# Patient Record
Sex: Female | Born: 1960 | Race: White | Hispanic: No | Smoking: Former smoker
Health system: Southern US, Community
[De-identification: ages and names within clinical notes are randomized; demographics above are authoritative.]

## PROBLEM LIST (undated history)

## (undated) DIAGNOSIS — R112 Nausea with vomiting, unspecified: Secondary | ICD-10-CM

## (undated) DIAGNOSIS — E039 Hypothyroidism, unspecified: Secondary | ICD-10-CM

## (undated) DIAGNOSIS — T8859XA Other complications of anesthesia, initial encounter: Secondary | ICD-10-CM

## (undated) DIAGNOSIS — D649 Anemia, unspecified: Secondary | ICD-10-CM

## (undated) DIAGNOSIS — I10 Essential (primary) hypertension: Secondary | ICD-10-CM

## (undated) DIAGNOSIS — Z9889 Other specified postprocedural states: Secondary | ICD-10-CM

## (undated) DIAGNOSIS — M1990 Unspecified osteoarthritis, unspecified site: Secondary | ICD-10-CM

## (undated) DIAGNOSIS — T4145XA Adverse effect of unspecified anesthetic, initial encounter: Secondary | ICD-10-CM

## (undated) DIAGNOSIS — F419 Anxiety disorder, unspecified: Secondary | ICD-10-CM

## (undated) DIAGNOSIS — Z87898 Personal history of other specified conditions: Secondary | ICD-10-CM

## (undated) DIAGNOSIS — E785 Hyperlipidemia, unspecified: Secondary | ICD-10-CM

## (undated) DIAGNOSIS — K219 Gastro-esophageal reflux disease without esophagitis: Secondary | ICD-10-CM

## (undated) HISTORY — PX: ABDOMINAL EXPLORATION SURGERY: SHX538

---

## 2013-09-21 ENCOUNTER — Other Ambulatory Visit: Payer: Self-pay | Admitting: Neurosurgery

## 2013-09-22 ENCOUNTER — Encounter (HOSPITAL_COMMUNITY): Payer: Self-pay | Admitting: Pharmacy Technician

## 2013-09-28 ENCOUNTER — Encounter (HOSPITAL_COMMUNITY): Payer: Self-pay | Admitting: Pharmacy Technician

## 2013-09-28 ENCOUNTER — Ambulatory Visit (HOSPITAL_COMMUNITY): Admission: RE | Admit: 2013-09-28 | Payer: Worker's Compensation | Source: Ambulatory Visit | Admitting: Neurosurgery

## 2013-09-28 ENCOUNTER — Encounter (HOSPITAL_COMMUNITY): Admission: RE | Payer: Self-pay | Source: Ambulatory Visit

## 2013-09-28 SURGERY — ANTERIOR CERVICAL DECOMPRESSION/DISCECTOMY FUSION 2 LEVELS
Anesthesia: General

## 2013-10-05 ENCOUNTER — Ambulatory Visit (HOSPITAL_COMMUNITY)
Admission: RE | Admit: 2013-10-05 | Discharge: 2013-10-05 | Disposition: A | Discharge status: Home / Self Care | Payer: Self-pay | Source: Ambulatory Visit | Attending: Anesthesiology | Admitting: Anesthesiology

## 2013-10-05 ENCOUNTER — Encounter (HOSPITAL_COMMUNITY)
Admission: RE | Admit: 2013-10-05 | Discharge: 2013-10-05 | Disposition: A | Discharge status: Home / Self Care | Payer: Worker's Compensation | Source: Ambulatory Visit | Attending: Neurosurgery | Admitting: Neurosurgery

## 2013-10-05 ENCOUNTER — Encounter (HOSPITAL_COMMUNITY): Payer: Self-pay

## 2013-10-05 ENCOUNTER — Other Ambulatory Visit: Payer: Self-pay | Admitting: Neurosurgery

## 2013-10-05 DIAGNOSIS — I10 Essential (primary) hypertension: Secondary | ICD-10-CM | POA: Insufficient documentation

## 2013-10-05 DIAGNOSIS — Z87891 Personal history of nicotine dependence: Secondary | ICD-10-CM | POA: Insufficient documentation

## 2013-10-05 DIAGNOSIS — Z01812 Encounter for preprocedural laboratory examination: Secondary | ICD-10-CM | POA: Insufficient documentation

## 2013-10-05 DIAGNOSIS — Z01818 Encounter for other preprocedural examination: Secondary | ICD-10-CM | POA: Insufficient documentation

## 2013-10-05 DIAGNOSIS — Z01810 Encounter for preprocedural cardiovascular examination: Secondary | ICD-10-CM | POA: Insufficient documentation

## 2013-10-05 HISTORY — DX: Hypothyroidism, unspecified: E03.9

## 2013-10-05 HISTORY — DX: Other complications of anesthesia, initial encounter: T88.59XA

## 2013-10-05 HISTORY — DX: Other specified postprocedural states: Z98.890

## 2013-10-05 HISTORY — DX: Hyperlipidemia, unspecified: E78.5

## 2013-10-05 HISTORY — DX: Anemia, unspecified: D64.9

## 2013-10-05 HISTORY — DX: Essential (primary) hypertension: I10

## 2013-10-05 HISTORY — DX: Personal history of other specified conditions: Z87.898

## 2013-10-05 HISTORY — DX: Gastro-esophageal reflux disease without esophagitis: K21.9

## 2013-10-05 HISTORY — DX: Other specified postprocedural states: R11.2

## 2013-10-05 HISTORY — DX: Unspecified osteoarthritis, unspecified site: M19.90

## 2013-10-05 HISTORY — DX: Adverse effect of unspecified anesthetic, initial encounter: T41.45XA

## 2013-10-05 HISTORY — DX: Anxiety disorder, unspecified: F41.9

## 2013-10-05 LAB — CBC WITH DIFFERENTIAL/PLATELET
BASOS PCT: 0 % (ref 0–1)
Basophils Absolute: 0 10*3/uL (ref 0.0–0.1)
Eosinophils Absolute: 0.2 10*3/uL (ref 0.0–0.7)
Eosinophils Relative: 3 % (ref 0–5)
HCT: 38.8 % (ref 36.0–46.0)
Hemoglobin: 13.3 g/dL (ref 12.0–15.0)
LYMPHS ABS: 2.2 10*3/uL (ref 0.7–4.0)
Lymphocytes Relative: 26 % (ref 12–46)
MCH: 31.9 pg (ref 26.0–34.0)
MCHC: 34.3 g/dL (ref 30.0–36.0)
MCV: 93 fL (ref 78.0–100.0)
Monocytes Absolute: 1 10*3/uL (ref 0.1–1.0)
Monocytes Relative: 11 % (ref 3–12)
NEUTROS ABS: 5.3 10*3/uL (ref 1.7–7.7)
NEUTROS PCT: 61 % (ref 43–77)
Platelets: 206 10*3/uL (ref 150–400)
RBC: 4.17 MIL/uL (ref 3.87–5.11)
RDW: 13.3 % (ref 11.5–15.5)
WBC: 8.7 10*3/uL (ref 4.0–10.5)

## 2013-10-05 LAB — BASIC METABOLIC PANEL
BUN: 14 mg/dL (ref 6–23)
CO2: 25 mEq/L (ref 19–32)
Calcium: 9.5 mg/dL (ref 8.4–10.5)
Chloride: 105 mEq/L (ref 96–112)
Creatinine, Ser: 0.72 mg/dL (ref 0.50–1.10)
Glucose, Bld: 93 mg/dL (ref 70–99)
Potassium: 4.3 mEq/L (ref 3.7–5.3)
SODIUM: 143 meq/L (ref 137–147)

## 2013-10-05 LAB — SURGICAL PCR SCREEN
MRSA, PCR: NEGATIVE
Staphylococcus aureus: NEGATIVE

## 2013-10-05 NOTE — Progress Notes (Signed)
[facility] [place] [facility] [place] [facility] for primary No cardiologist or cardiac testing in past

## 2013-10-05 NOTE — Progress Notes (Signed)
Patient was instructed to not leave until bp could be reassessed but patient left before bp was rechecked. Will assess on morning of surgery. bp upon arrival to PAT appointment was taken several times with dinamap with "error reading" before the low reading showed.

## 2013-10-05 NOTE — Progress Notes (Signed)
10/05/13 1249  OBSTRUCTIVE SLEEP APNEA  Have you ever been diagnosed with sleep apnea through a sleep study? No  Do you snore loudly (loud enough to be heard through closed doors)?  1  Do you often feel tired, fatigued, or sleepy during the daytime? 1  Has anyone observed you stop breathing during your sleep? 0  Do you have, or are you being treated for high blood pressure? 1  BMI more than 35 kg/m2? 0  Age over 55 years old? 1  Neck circumference greater than 40 cm/18 inches? 0  Gender: 0  Obstructive Sleep Apnea Score 4  Score 4 or greater  Results sent to PCP

## 2013-10-05 NOTE — Pre-Procedure Instructions (Signed)
Frederic JerichoDarlene Lothrop  10/05/2013   Your procedure is scheduled on:  Monday, March 16th  Report to Admitting at 0800 AM.  Call this number if you have problems the morning of surgery: [phone]   Remember:   Do not eat food or drink liquids after midnight.   Take these medicines the morning of surgery with A SIP OF WATER: norvasc, synthroid, lyrica, ultram if needed   Do not wear jewelry, make-up or nail polish.  Do not wear lotions, powders, or perfumes,deodorant.  Do not shave 48 hours prior to surgery. Men may shave face and neck.  Do not bring valuables to the hospital.  [facility] belongings or valuables.               Contacts, dentures or bridgework may not be worn into surgery.  Leave suitcase in the car. After surgery it may be brought to your room.  For patients admitted to the hospital, discharge time is determined by your treatment team.               Patients discharged the day of surgery will not be allowed to drive home.  Please read over the following fact sheets that you were given: Pain Booklet, Coughing and Deep Breathing, MRSA Information and Surgical Site Infection Prevention [place] - Preparing for Surgery  Before surgery, you can play an important role.  Because skin is not sterile, your skin needs to be as free of germs as possible.  You can reduce the number of germs on you skin by washing with CHG (chlorahexidine gluconate) soap before surgery.  CHG is an antiseptic cleaner which kills germs and bonds with the skin to continue killing germs even after washing.  Please DO NOT use if you have an allergy to CHG or antibacterial soaps.  If your skin becomes reddened/irritated stop using the CHG and inform your nurse when you arrive at Short Stay.  Do not shave (including legs and underarms) for at least 48 hours prior to the first CHG shower.  You may shave your face.  Please follow these instructions carefully:   1.  Shower with  CHG Soap the night before surgery and the morning of Surgery.  2.  If you choose to wash your hair, wash your hair first as usual with your normal shampoo.  3.  After you shampoo, rinse your hair and body thoroughly to remove the shampoo.  4.  Use CHG as you would any other liquid soap.  You can apply CHG directly to the skin and wash gently with scrungie or a clean washcloth.  5.  Apply the CHG Soap to your body ONLY FROM THE NECK DOWN.  Do not use on open wounds or open sores.  Avoid contact with your eyes, ears, mouth and genitals (private parts).  Wash genitals (private parts) with your normal soap.  6.  Wash thoroughly, paying special attention to the area where your surgery will be performed.  7.  Thoroughly rinse your body with warm water from the neck down.  8.  DO NOT shower/wash with your normal soap after using and rinsing off the CHG Soap.  9.  Pat yourself dry with a clean towel.            10.  Wear clean pajamas.            11.  Place clean sheets on your bed the night of your first shower and do not sleep  with pets.  Day of Surgery  Do not apply any lotions/deodorants the morning of surgery.  Please wear clean clothes to the hospital/surgery center.

## 2013-10-05 NOTE — Progress Notes (Signed)
Called vanessa at dr. [facility] office regarding need for consent order.  Labs/pre op orders were in epic but consent form was not.

## 2013-10-11 MED ORDER — CEFAZOLIN SODIUM-DEXTROSE 2-3 GM-% IV SOLR
2.0000 g | INTRAVENOUS | Status: AC
  Administered 2013-10-12: 2 g via INTRAVENOUS
  Filled 2013-10-11: qty 50

## 2013-10-12 ENCOUNTER — Ambulatory Visit (HOSPITAL_COMMUNITY): Payer: Worker's Compensation | Admitting: Anesthesiology

## 2013-10-12 ENCOUNTER — Encounter (HOSPITAL_COMMUNITY)
Admission: RE | Disposition: A | Discharge status: Home / Self Care | Payer: Self-pay | Source: Ambulatory Visit | Attending: Neurosurgery

## 2013-10-12 ENCOUNTER — Encounter (HOSPITAL_COMMUNITY): Payer: Self-pay | Admitting: *Deleted

## 2013-10-12 ENCOUNTER — Encounter (HOSPITAL_COMMUNITY): Payer: Worker's Compensation | Admitting: Anesthesiology

## 2013-10-12 ENCOUNTER — Ambulatory Visit (HOSPITAL_COMMUNITY): Payer: Worker's Compensation

## 2013-10-12 ENCOUNTER — Ambulatory Visit (HOSPITAL_COMMUNITY)
Admission: RE | Admit: 2013-10-12 | Discharge: 2013-10-12 | Disposition: A | Discharge status: Home / Self Care | Payer: Worker's Compensation | Source: Ambulatory Visit | Attending: Neurosurgery | Admitting: Neurosurgery

## 2013-10-12 DIAGNOSIS — Z881 Allergy status to other antibiotic agents status: Secondary | ICD-10-CM | POA: Insufficient documentation

## 2013-10-12 DIAGNOSIS — K219 Gastro-esophageal reflux disease without esophagitis: Secondary | ICD-10-CM | POA: Insufficient documentation

## 2013-10-12 DIAGNOSIS — I10 Essential (primary) hypertension: Secondary | ICD-10-CM | POA: Insufficient documentation

## 2013-10-12 DIAGNOSIS — F411 Generalized anxiety disorder: Secondary | ICD-10-CM | POA: Insufficient documentation

## 2013-10-12 DIAGNOSIS — Z91018 Allergy to other foods: Secondary | ICD-10-CM | POA: Insufficient documentation

## 2013-10-12 DIAGNOSIS — Z885 Allergy status to narcotic agent status: Secondary | ICD-10-CM | POA: Insufficient documentation

## 2013-10-12 DIAGNOSIS — E785 Hyperlipidemia, unspecified: Secondary | ICD-10-CM | POA: Insufficient documentation

## 2013-10-12 DIAGNOSIS — M47812 Spondylosis without myelopathy or radiculopathy, cervical region: Secondary | ICD-10-CM

## 2013-10-12 DIAGNOSIS — Z79899 Other long term (current) drug therapy: Secondary | ICD-10-CM | POA: Insufficient documentation

## 2013-10-12 DIAGNOSIS — E039 Hypothyroidism, unspecified: Secondary | ICD-10-CM | POA: Insufficient documentation

## 2013-10-12 HISTORY — PX: ANTERIOR CERVICAL DECOMP/DISCECTOMY FUSION: SHX1161

## 2013-10-12 SURGERY — ANTERIOR CERVICAL DECOMPRESSION/DISCECTOMY FUSION 2 LEVELS
Anesthesia: General

## 2013-10-12 MED ORDER — PHENYLEPHRINE HCL 10 MG/ML IJ SOLN
INTRAMUSCULAR | Status: AC
  Filled 2013-10-12: qty 1

## 2013-10-12 MED ORDER — SIMVASTATIN 20 MG PO TABS
20.0000 mg | ORAL_TABLET | Freq: Every day | ORAL | Status: DC
  Filled 2013-10-12: qty 1

## 2013-10-12 MED ORDER — LEVOTHYROXINE SODIUM 50 MCG PO TABS
50.0000 ug | ORAL_TABLET | Freq: Every day | ORAL | Status: DC
  Filled 2013-10-12: qty 1

## 2013-10-12 MED ORDER — TRAMADOL HCL 50 MG PO TABS
50.0000 mg | ORAL_TABLET | Freq: Every day | ORAL | Status: DC | PRN
  Administered 2013-10-12: 100 mg via ORAL
  Filled 2013-10-12: qty 2

## 2013-10-12 MED ORDER — ACETAMINOPHEN 650 MG RE SUPP: 650.0000 mg | RECTAL | Status: DC | PRN

## 2013-10-12 MED ORDER — GLYCOPYRROLATE 0.2 MG/ML IJ SOLN
INTRAMUSCULAR | Status: DC | PRN
  Administered 2013-10-12: 0.4 mg via INTRAVENOUS

## 2013-10-12 MED ORDER — SODIUM CHLORIDE 0.9 % IJ SOLN
3.0000 mL | Freq: Two times a day (BID) | INTRAMUSCULAR | Status: DC
  Administered 2013-10-12: 3 mL via INTRAVENOUS

## 2013-10-12 MED ORDER — SODIUM CHLORIDE 0.9 % IJ SOLN: 3.0000 mL | INTRAMUSCULAR | Status: DC | PRN

## 2013-10-12 MED ORDER — DEXAMETHASONE SODIUM PHOSPHATE 10 MG/ML IJ SOLN
INTRAMUSCULAR | Status: AC
  Administered 2013-10-12: 10 mg via INTRAVENOUS
  Filled 2013-10-12: qty 1

## 2013-10-12 MED ORDER — THROMBIN 5000 UNITS EX SOLR
OROMUCOSAL | Status: DC | PRN
  Administered 2013-10-12: 11:00:00 via TOPICAL

## 2013-10-12 MED ORDER — ROCURONIUM BROMIDE 100 MG/10ML IV SOLN
INTRAVENOUS | Status: DC | PRN
  Administered 2013-10-12: 50 mg via INTRAVENOUS

## 2013-10-12 MED ORDER — HYDROCODONE-ACETAMINOPHEN 5-325 MG PO TABS: 1.0000 | ORAL_TABLET | ORAL | Status: DC | PRN

## 2013-10-12 MED ORDER — NEOSTIGMINE METHYLSULFATE 1 MG/ML IJ SOLN
INTRAMUSCULAR | Status: DC | PRN
  Administered 2013-10-12: 3 mg via INTRAVENOUS

## 2013-10-12 MED ORDER — LIDOCAINE HCL (CARDIAC) 20 MG/ML IV SOLN
INTRAVENOUS | Status: DC | PRN
  Administered 2013-10-12: 80 mg via INTRAVENOUS

## 2013-10-12 MED ORDER — CEFAZOLIN SODIUM 1-5 GM-% IV SOLN
1.0000 g | Freq: Three times a day (TID) | INTRAVENOUS | Status: DC
  Administered 2013-10-12: 1 g via INTRAVENOUS
  Filled 2013-10-12 (×2): qty 50

## 2013-10-12 MED ORDER — ACETAMINOPHEN 325 MG PO TABS: 650.0000 mg | ORAL_TABLET | ORAL | Status: DC | PRN

## 2013-10-12 MED ORDER — PROPOFOL 10 MG/ML IV BOLUS
INTRAVENOUS | Status: AC
  Filled 2013-10-12: qty 20

## 2013-10-12 MED ORDER — ONDANSETRON HCL 4 MG/2ML IJ SOLN
4.0000 mg | INTRAMUSCULAR | Status: DC | PRN
Start: 2013-10-12 — End: 2013-10-12

## 2013-10-12 MED ORDER — PROMETHAZINE HCL 25 MG/ML IJ SOLN: 6.2500 mg | INTRAMUSCULAR | Status: DC | PRN

## 2013-10-12 MED ORDER — OXYCODONE-ACETAMINOPHEN 5-325 MG PO TABS: 1.0000 | ORAL_TABLET | ORAL | Status: DC | PRN

## 2013-10-12 MED ORDER — ROCURONIUM BROMIDE 50 MG/5ML IV SOLN
INTRAVENOUS | Status: AC
  Filled 2013-10-12: qty 1

## 2013-10-12 MED ORDER — PREGABALIN 50 MG PO CAPS: 75.0000 mg | ORAL_CAPSULE | Freq: Two times a day (BID) | ORAL | Status: DC

## 2013-10-12 MED ORDER — FENTANYL CITRATE 0.05 MG/ML IJ SOLN
INTRAMUSCULAR | Status: AC
  Filled 2013-10-12: qty 5

## 2013-10-12 MED ORDER — MIDAZOLAM HCL 2 MG/2ML IJ SOLN
INTRAMUSCULAR | Status: AC
  Filled 2013-10-12: qty 2

## 2013-10-12 MED ORDER — PHENOL 1.4 % MT LIQD: 1.0000 | OROMUCOSAL | Status: DC | PRN

## 2013-10-12 MED ORDER — PHENYLEPHRINE HCL 10 MG/ML IJ SOLN
INTRAMUSCULAR | Status: DC | PRN
  Administered 2013-10-12: 120 ug via INTRAVENOUS
  Administered 2013-10-12 (×2): 80 ug via INTRAVENOUS
  Administered 2013-10-12: 120 ug via INTRAVENOUS

## 2013-10-12 MED ORDER — LACTATED RINGERS IV SOLN
INTRAVENOUS | Status: DC
  Administered 2013-10-12 (×2): via INTRAVENOUS

## 2013-10-12 MED ORDER — THROMBIN 5000 UNITS EX SOLR
CUTANEOUS | Status: DC | PRN
  Administered 2013-10-12 (×2): 5000 [IU] via TOPICAL

## 2013-10-12 MED ORDER — TIZANIDINE HCL 4 MG PO TABS
4.0000 mg | ORAL_TABLET | Freq: Four times a day (QID) | ORAL | Status: DC | PRN
  Administered 2013-10-12: 4 mg via ORAL
  Filled 2013-10-12: qty 1

## 2013-10-12 MED ORDER — SCOPOLAMINE 1 MG/3DAYS TD PT72
MEDICATED_PATCH | TRANSDERMAL | Status: DC | PRN
  Administered 2013-10-12: 1 via TRANSDERMAL

## 2013-10-12 MED ORDER — MEPERIDINE HCL 25 MG/ML IJ SOLN: 6.2500 mg | INTRAMUSCULAR | Status: DC | PRN

## 2013-10-12 MED ORDER — MIDAZOLAM HCL 2 MG/2ML IJ SOLN: 0.5000 mg | Freq: Once | INTRAMUSCULAR | Status: DC | PRN

## 2013-10-12 MED ORDER — AMLODIPINE BESYLATE 10 MG PO TABS: 10.0000 mg | ORAL_TABLET | Freq: Every day | ORAL | Status: DC

## 2013-10-12 MED ORDER — MENTHOL 3 MG MT LOZG: 1.0000 | LOZENGE | OROMUCOSAL | Status: DC | PRN

## 2013-10-12 MED ORDER — SODIUM CHLORIDE 0.9 % IV SOLN
10.0000 mg | INTRAVENOUS | Status: DC | PRN
  Administered 2013-10-12: 10 ug/min via INTRAVENOUS

## 2013-10-12 MED ORDER — EPHEDRINE SULFATE 50 MG/ML IJ SOLN
INTRAMUSCULAR | Status: DC | PRN
  Administered 2013-10-12 (×2): 5 mg via INTRAVENOUS
  Administered 2013-10-12: 10 mg via INTRAVENOUS

## 2013-10-12 MED ORDER — HYDROMORPHONE HCL PF 1 MG/ML IJ SOLN
INTRAMUSCULAR | Status: AC
  Filled 2013-10-12: qty 1

## 2013-10-12 MED ORDER — SODIUM CHLORIDE 0.9 % IR SOLN
Status: DC | PRN
  Administered 2013-10-12: 11:00:00

## 2013-10-12 MED ORDER — HYDROMORPHONE HCL PF 1 MG/ML IJ SOLN
0.2500 mg | INTRAMUSCULAR | Status: DC | PRN
  Administered 2013-10-12 (×3): 0.5 mg via INTRAVENOUS

## 2013-10-12 MED ORDER — VECURONIUM BROMIDE 10 MG IV SOLR
INTRAVENOUS | Status: AC
  Filled 2013-10-12: qty 10

## 2013-10-12 MED ORDER — ONDANSETRON HCL 4 MG/2ML IJ SOLN
INTRAMUSCULAR | Status: AC
  Filled 2013-10-12: qty 2

## 2013-10-12 MED ORDER — GLYCOPYRROLATE 0.2 MG/ML IJ SOLN
INTRAMUSCULAR | Status: AC
  Filled 2013-10-12: qty 2

## 2013-10-12 MED ORDER — CYCLOBENZAPRINE HCL 10 MG PO TABS: 10.0000 mg | ORAL_TABLET | Freq: Three times a day (TID) | ORAL | Status: DC | PRN

## 2013-10-12 MED ORDER — HYDROMORPHONE HCL PF 1 MG/ML IJ SOLN: 0.5000 mg | INTRAMUSCULAR | Status: DC | PRN

## 2013-10-12 MED ORDER — PROPOFOL 10 MG/ML IV BOLUS
INTRAVENOUS | Status: DC | PRN
  Administered 2013-10-12: 120 mg via INTRAVENOUS

## 2013-10-12 MED ORDER — OXYCODONE HCL 5 MG PO TABS: 5.0000 mg | ORAL_TABLET | ORAL | Status: AC | PRN

## 2013-10-12 MED ORDER — 0.9 % SODIUM CHLORIDE (POUR BTL) OPTIME
TOPICAL | Status: DC | PRN
  Administered 2013-10-12: 1000 mL

## 2013-10-12 MED ORDER — FENTANYL CITRATE 0.05 MG/ML IJ SOLN
INTRAMUSCULAR | Status: DC | PRN
  Administered 2013-10-12: 50 ug via INTRAVENOUS
  Administered 2013-10-12: 100 ug via INTRAVENOUS
  Administered 2013-10-12 (×2): 50 ug via INTRAVENOUS

## 2013-10-12 MED ORDER — STERILE WATER FOR INJECTION IJ SOLN
INTRAMUSCULAR | Status: AC
  Filled 2013-10-12: qty 10

## 2013-10-12 MED ORDER — ONDANSETRON HCL 4 MG/2ML IJ SOLN
INTRAMUSCULAR | Status: DC | PRN
  Administered 2013-10-12: 4 mg via INTRAVENOUS

## 2013-10-12 MED ORDER — SENNA 8.6 MG PO TABS: 1.0000 | ORAL_TABLET | Freq: Two times a day (BID) | ORAL | Status: DC

## 2013-10-12 MED ORDER — SODIUM CHLORIDE 0.9 % IJ SOLN
INTRAMUSCULAR | Status: AC
  Filled 2013-10-12: qty 10

## 2013-10-12 MED ORDER — EPHEDRINE SULFATE 50 MG/ML IJ SOLN
INTRAMUSCULAR | Status: AC
  Filled 2013-10-12: qty 1

## 2013-10-12 MED ORDER — OXYCODONE HCL 5 MG/5ML PO SOLN: 5.0000 mg | Freq: Once | ORAL | Status: DC | PRN

## 2013-10-12 MED ORDER — HEMOSTATIC AGENTS (NO CHARGE) OPTIME
TOPICAL | Status: DC | PRN
  Administered 2013-10-12: 1

## 2013-10-12 MED ORDER — ALUM & MAG HYDROXIDE-SIMETH 200-200-20 MG/5ML PO SUSP: 30.0000 mL | Freq: Four times a day (QID) | ORAL | Status: DC | PRN

## 2013-10-12 MED ORDER — LIDOCAINE HCL (CARDIAC) 20 MG/ML IV SOLN
INTRAVENOUS | Status: AC
Start: 2013-10-12 — End: 2013-10-12
  Filled 2013-10-12: qty 5

## 2013-10-12 MED ORDER — DEXAMETHASONE SODIUM PHOSPHATE 10 MG/ML IJ SOLN: 10.0000 mg | INTRAMUSCULAR | Status: DC

## 2013-10-12 MED ORDER — OXYCODONE HCL 5 MG PO TABS: 5.0000 mg | ORAL_TABLET | Freq: Once | ORAL | Status: DC | PRN

## 2013-10-12 MED ORDER — SCOPOLAMINE 1 MG/3DAYS TD PT72
MEDICATED_PATCH | TRANSDERMAL | Status: AC
  Filled 2013-10-12: qty 1

## 2013-10-12 MED ORDER — CYCLOBENZAPRINE HCL 10 MG PO TABS: 10.0000 mg | ORAL_TABLET | Freq: Three times a day (TID) | ORAL | Status: AC | PRN

## 2013-10-12 MED ORDER — MIDAZOLAM HCL 5 MG/5ML IJ SOLN
INTRAMUSCULAR | Status: DC | PRN
  Administered 2013-10-12: 2 mg via INTRAVENOUS

## 2013-10-12 SURGICAL SUPPLY — 62 items
BAG DECANTER FOR FLEXI CONT (MISCELLANEOUS) ×3 IMPLANT
BENZOIN TINCTURE PRP APPL 2/3 (GAUZE/BANDAGES/DRESSINGS) ×3 IMPLANT
BRUSH SCRUB EZ PLAIN DRY (MISCELLANEOUS) ×3 IMPLANT
BUR MATCHSTICK NEURO 3.0 LAGG (BURR) ×3 IMPLANT
CAGE PEEK 6X14X11 (Cage) ×4 IMPLANT
CAGE SPNL 11X14X6XRADOPQ (Cage) ×2 IMPLANT
CANISTER SUCT 3000ML (MISCELLANEOUS) ×3 IMPLANT
CLOSURE WOUND 1/2 X4 (GAUZE/BANDAGES/DRESSINGS) ×1
CONT SPEC 4OZ CLIKSEAL STRL BL (MISCELLANEOUS) ×3 IMPLANT
DRAPE C-ARM 42X72 X-RAY (DRAPES) ×6 IMPLANT
DRAPE LAPAROTOMY 100X72 PEDS (DRAPES) ×3 IMPLANT
DRAPE MICROSCOPE ZEISS OPMI (DRAPES) ×3 IMPLANT
DRAPE POUCH INSTRU U-SHP 10X18 (DRAPES) ×3 IMPLANT
DRILL BIT (BIT) ×3 IMPLANT
DURAPREP 6ML APPLICATOR 50/CS (WOUND CARE) ×3 IMPLANT
ELECT COATED BLADE 2.86 ST (ELECTRODE) ×3 IMPLANT
ELECT REM PT RETURN 9FT ADLT (ELECTROSURGICAL) ×3
ELECTRODE REM PT RTRN 9FT ADLT (ELECTROSURGICAL) ×1 IMPLANT
GAUZE SPONGE 4X4 16PLY XRAY LF (GAUZE/BANDAGES/DRESSINGS) IMPLANT
GLOVE BIO SURGEON STRL SZ8 (GLOVE) ×3 IMPLANT
GLOVE BIOGEL PI IND STRL 7.0 (GLOVE) ×2 IMPLANT
GLOVE BIOGEL PI INDICATOR 7.0 (GLOVE) ×4
GLOVE ECLIPSE 8.5 STRL (GLOVE) ×3 IMPLANT
GLOVE EXAM NITRILE LRG STRL (GLOVE) IMPLANT
GLOVE EXAM NITRILE MD LF STRL (GLOVE) IMPLANT
GLOVE EXAM NITRILE XL STR (GLOVE) IMPLANT
GLOVE EXAM NITRILE XS STR PU (GLOVE) IMPLANT
GLOVE INDICATOR 8.5 STRL (GLOVE) ×3 IMPLANT
GLOVE SS BIOGEL STRL SZ 6.5 (GLOVE) ×3 IMPLANT
GLOVE SUPERSENSE BIOGEL SZ 6.5 (GLOVE) ×6
GOWN BRE IMP SLV AUR LG STRL (GOWN DISPOSABLE) IMPLANT
GOWN BRE IMP SLV AUR XL STRL (GOWN DISPOSABLE) ×3 IMPLANT
GOWN STRL REIN 2XL LVL4 (GOWN DISPOSABLE) IMPLANT
GOWN STRL REUS W/ TWL LRG LVL3 (GOWN DISPOSABLE) ×2 IMPLANT
GOWN STRL REUS W/ TWL XL LVL3 (GOWN DISPOSABLE) ×1 IMPLANT
GOWN STRL REUS W/TWL LRG LVL3 (GOWN DISPOSABLE) ×4
GOWN STRL REUS W/TWL XL LVL3 (GOWN DISPOSABLE) ×2
HALTER HD/CHIN CERV TRACTION D (MISCELLANEOUS) ×3 IMPLANT
HEMOSTAT SURGICEL 2X14 (HEMOSTASIS) IMPLANT
KIT BASIN OR (CUSTOM PROCEDURE TRAY) ×3 IMPLANT
KIT ROOM TURNOVER OR (KITS) ×3 IMPLANT
NEEDLE SPNL 20GX3.5 QUINCKE YW (NEEDLE) ×3 IMPLANT
NS IRRIG 1000ML POUR BTL (IV SOLUTION) ×3 IMPLANT
PACK LAMINECTOMY NEURO (CUSTOM PROCEDURE TRAY) ×3 IMPLANT
PAD ARMBOARD 7.5X6 YLW CONV (MISCELLANEOUS) ×9 IMPLANT
PLATE 37MM ×3 IMPLANT
RUBBERBAND STERILE (MISCELLANEOUS) ×6 IMPLANT
SCREW ST 13X4XST VA NS SPNE (Screw) ×6 IMPLANT
SCREW ST VAR 4 ATL (Screw) ×12 IMPLANT
SPONGE GAUZE 4X4 12PLY (GAUZE/BANDAGES/DRESSINGS) ×3 IMPLANT
SPONGE INTESTINAL PEANUT (DISPOSABLE) ×3 IMPLANT
SPONGE SURGIFOAM ABS GEL SZ50 (HEMOSTASIS) ×3 IMPLANT
STRIP CLOSURE SKIN 1/2X4 (GAUZE/BANDAGES/DRESSINGS) ×2 IMPLANT
SUT PDS AB 5-0 P3 18 (SUTURE) ×3 IMPLANT
SUT VIC AB 3-0 SH 8-18 (SUTURE) ×3 IMPLANT
SYR 20ML ECCENTRIC (SYRINGE) ×3 IMPLANT
TAPE CLOTH 4X10 WHT NS (GAUZE/BANDAGES/DRESSINGS) ×3 IMPLANT
TAPE CLOTH SURG 4X10 WHT LF (GAUZE/BANDAGES/DRESSINGS) ×3 IMPLANT
TOWEL OR 17X24 6PK STRL BLUE (TOWEL DISPOSABLE) ×3 IMPLANT
TOWEL OR 17X26 10 PK STRL BLUE (TOWEL DISPOSABLE) ×3 IMPLANT
TRAP SPECIMEN MUCOUS 40CC (MISCELLANEOUS) ×3 IMPLANT
WATER STERILE IRR 1000ML POUR (IV SOLUTION) ×3 IMPLANT

## 2013-10-12 NOTE — Discharge Summary (Signed)
Physician Discharge Summary  Patient ID: Katelyn JerichoDarlene Lobdell MRN: [id number] DOB/AGE: 05-Apr-1961 53 y.o.  Admit date: 10/12/2013 Discharge date: 10/12/2013  Admission Diagnoses:  Discharge Diagnoses:  Principal Problem:   Cervical spondylosis without myelopathy   Discharged Condition: good  Hospital Course: Patient in the hospital where she underwent uncomplicated 2 level anterior cervical decompression and fusion. Postoperative she is doing well. Preoperative pain improved. Strength and sensation intact. Ready for discharge home.  Consults:   Significant Diagnostic Studies:   Treatments:   Discharge Exam: Blood pressure 107/80, pulse 92, temperature 97.5 F (36.4 C), temperature source Oral, resp. rate 20, SpO2 91.00%. Awake and alert. Oriented and appropriate. Cranial nerve function intact. Motor and sensory function extremities normal. Wound clean and dry. Chest and abdomen benign.  Disposition: Final discharge disposition not confirmed     Medication List         amLODipine 10 MG tablet  Commonly known as:  NORVASC  Take 10 mg by mouth daily.     cyclobenzaprine 10 MG tablet  Commonly known as:  FLEXERIL  Take 1 tablet (10 mg total) by mouth 3 (three) times daily as needed for muscle spasms.     levothyroxine 50 MCG tablet  Commonly known as:  SYNTHROID, LEVOTHROID  Take 50 mcg by mouth daily before breakfast.     oxyCODONE 5 MG immediate release tablet  Commonly known as:  ROXICODONE  Take 1-2 tablets (5-10 mg total) by mouth every 4 (four) hours as needed for severe pain.     pravastatin 40 MG tablet  Commonly known as:  PRAVACHOL  Take 40 mg by mouth daily.     pregabalin 75 MG capsule  Commonly known as:  LYRICA  Take 75 mg by mouth 2 (two) times daily.     tiZANidine 4 MG tablet  Commonly known as:  ZANAFLEX  Take 4 mg by mouth every 6 (six) hours as needed for muscle spasms.     traMADol 50 MG tablet  Commonly known as:  ULTRAM  Take 50-100 mg  by mouth daily. Take 1 tablet when pain starts and if pain continues take two tablets         Signed: Peyton Rossner A 10/12/2013, 5:10 PM

## 2013-10-12 NOTE — Preoperative (Signed)
Beta Blockers   Reason not to administer Beta Blockers:Not Applicable 

## 2013-10-12 NOTE — Brief Op Note (Signed)
10/12/2013  11:45 AM  PATIENT:  Katelyn Mcdaniel  53 y.o. female  PRE-OPERATIVE DIAGNOSIS:  spondylosis/myelopathy  POST-OPERATIVE DIAGNOSIS:  spondylosis/myelopathy  PROCEDURE:  Procedure(s): CERVICAL FIVE TO SIX, CERVICAL SIX TO SEVEN ANTERIOR CERVICAL DECOMPRESSION/DISCECTOMY FUSION 2 LEVELS (N/A)  SURGEON:  Surgeon(s) and Role:    * Temple PaciniHenry A Zair Borawski, MD - Primary    * Mariam DollarGary P Cram, MD - Assisting  PHYSICIAN ASSISTANT:   ASSISTANTS:    ANESTHESIA:   general  EBL:  Total I/O In: 1000 [I.V.:1000] Out: 100 [Blood:100]  BLOOD ADMINISTERED:none  DRAINS: none   LOCAL MEDICATIONS USED:  NONE  SPECIMEN:  No Specimen  DISPOSITION OF SPECIMEN:  N/A  COUNTS:  YES  TOURNIQUET:  * No tourniquets in log *  DICTATION: .Dragon Dictation  PLAN OF CARE: Admit for overnight observation  PATIENT DISPOSITION:  PACU - hemodynamically stable.   Delay start of Pharmacological VTE agent (>24hrs) due to surgical blood loss or risk of bleeding: yes

## 2013-10-12 NOTE — Anesthesia Preprocedure Evaluation (Addendum)
Anesthesia Evaluation  Patient identified by MRN, date of birth, ID band Patient awake    Reviewed: Allergy & Precautions, H&P , NPO status , Patient's Chart, lab work & pertinent test results, reviewed documented beta blocker date and time   History of Anesthesia Complications (+) PONV and history of anesthetic complications  Airway Mallampati: II TM Distance: >3 FB Neck ROM: Full    Dental  (+) Poor Dentition, Missing, Dental Advisory Given   Pulmonary COPDformer smoker (quit 1 month),  breath sounds clear to auscultation        Cardiovascular hypertension, Pt. on medications - anginaRhythm:Regular Rate:Normal     Neuro/Psych Chronic neck and L arm pain: tramadol    GI/Hepatic Neg liver ROS, GERD-  Controlled,  Endo/Other  Hypothyroidism Morbid obesity  Renal/GU negative Renal ROS     Musculoskeletal   Abdominal (+) + obese,   Peds  Hematology   Anesthesia Other Findings   Reproductive/Obstetrics                          Anesthesia Physical Anesthesia Plan  ASA: III  Anesthesia Plan: General   Post-op Pain Management:    Induction: Intravenous  Airway Management Planned: Oral ETT  Additional Equipment:   Intra-op Plan:   Post-operative Plan: Extubation in OR  Informed Consent: I have reviewed the patients History and Physical, chart, labs and discussed the procedure including the risks, benefits and alternatives for the proposed anesthesia with the patient or authorized representative who has indicated his/her understanding and acceptance.   Dental advisory given  Plan Discussed with: CRNA and Surgeon  Anesthesia Plan Comments: (Plan routine monitors, GETA)        Anesthesia Quick Evaluation

## 2013-10-12 NOTE — Transfer of Care (Signed)
Immediate Anesthesia Transfer of Care Note  Patient: Katelyn Mcdaniel  Procedure(s) Performed: Procedure(s): CERVICAL FIVE TO SIX, CERVICAL SIX TO SEVEN ANTERIOR CERVICAL DECOMPRESSION/DISCECTOMY FUSION 2 LEVELS (N/A)  Patient Location: PACU  Anesthesia Type:General  Level of Consciousness: awake, alert  and oriented  Airway & Oxygen Therapy: Patient Spontanous Breathing and Patient connected to nasal cannula oxygen  Post-op Assessment: Report given to PACU RN, Post -op Vital signs reviewed and stable and Patient moving all extremities X 4  Post vital signs: Reviewed and stable  Complications: No apparent anesthesia complications

## 2013-10-12 NOTE — Op Note (Signed)
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  Surgeon:Zanya Lindo A.Austine Kelsay, M.D.  Asst. Surgeon: Wynetta Emeryram  Anesthesia: General  Indication: 53 year old female with neck and left upper extremity pain paresthesias and numbness consistent with a mixed cervical radiculopathy. Workup demonstrates evidence of significant spondylosis and stenosis at C5-6 and C6-7. Patient presents now for two-level anterior cervical decompression in hopes of improving her symptoms.  Operative note: After induction of anesthesia, patient positioned supine with Extended and held in place of halter traction. Anterior cervical region prepped and draped sterilely. Incision made overlying C6. Dissection carried down to the level of the platysma. Is then divided vertically and dissection proceeded along the medial border of the sternocleidomastoid muscle and carotid sheath. Trachea and esophagus mobilized and retracted towards the left. Prevertebral fascia stripped off the anterior spinal column. Longus colli muscles elevated bilaterally. Deep self-retaining traction placed intraoperative fluoroscopy is used levels were confirmed. The spaces at C5-6 and C6-7 were then incised a 15 blade. Anterior osteophytes removed using high-speed drill and Leksell rongeurs and Kerrison rongeurs. Bone was saved for use and later autografting. A wide discectomy was performed using pituitary rongeurs forward and backward on progress Kerrison rongeurs and a high-speed drill. All elements the disc were removed down to level of the posterior annulus. Microscope and brought field these were microdissection of the spinal canal. Remaining aspects of  annulus and osteophytes removed down to the level posterior longitudinal ligament. Posterior longitudinal limb was elevated and resected in piecemeal fashion using Kerrison rongeurs. Wide central decompression was then performed by undercutting the bodies of C5 and C6. Decompression then proceeded out each neural foramen. Wide anterior foraminotomies were performed on course exiting C6 nerve roots bilaterally. At this point a very thorough decompression had been achieved. There was no evidence of injury to thecal sac and nerve roots. The procedure then repeated at C6-7 again without complication. Wound was irrigated with and bike solution. Gelfoam was placed topically for hemostasis. 6 mm Medtronic anatomic peek cages were then packed with locally harvested autograft. Each cage was then impacted in place recessed slightly from the anterior cortical margin of C5-6 and 7. 37 mm Atlantis anterior cervical plate was then placed over the C5, C6, C7 levels. This is an attachment or fluoroscopic guidance using 13 mm variable-angle screws 2 each in all 3 levels. All 6 screws final tightening down to be solidly within the bone. Locking screws and gauge at all 3 levels. Final images real good position the bone as well as the hardware at the proper upper level with normal alignment is fine. Wound is then irrigated out like solution. Hemostasis was assured. Wounds and closed in typical fashion. There was no apparent outpatient. The patient tolerated the procedure well and he returns to the recovery room postop.

## 2013-10-12 NOTE — Anesthesia Postprocedure Evaluation (Signed)
  Anesthesia Post-op Note  Patient: Katelyn Mcdaniel  Procedure(s) Performed: Procedure(s): CERVICAL FIVE TO SIX, CERVICAL SIX TO SEVEN ANTERIOR CERVICAL DECOMPRESSION/DISCECTOMY FUSION 2 LEVELS (N/A)  Patient Location: PACU  Anesthesia Type:General  Level of Consciousness: awake, alert , oriented and patient cooperative  Airway and Oxygen Therapy: Patient Spontanous Breathing and Patient connected to nasal cannula oxygen  Post-op Pain: none  Post-op Assessment: Post-op Vital signs reviewed, Patient's Cardiovascular Status Stable, Respiratory Function Stable, Patent Airway, No signs of Nausea or vomiting and Pain level controlled  Post-op Vital Signs: Reviewed and stable  Complications: No apparent anesthesia complications

## 2013-10-12 NOTE — Progress Notes (Signed)
Pt. Alert and oriented, follows simple instructions, denies pain. Incision area without swelling, redness or S/S of infection. Voiding adequate clear yellow urine. Moving all extremities well and vitals stable and documented. Patient discharged home with family. Anterior Cervical Fusion surgery notes instructions given to patient and family member for home safety and precautions. Pt. and family stated understanding of instructions given.  

## 2013-10-12 NOTE — Plan of Care (Signed)
Problem: Consults Goal: Diagnosis - Spinal Surgery Outcome: Completed/Met Date Met:  10/12/13 Cervical Spine Fusion

## 2013-10-12 NOTE — Discharge Instructions (Signed)

## 2013-10-12 NOTE — H&P (Signed)
Katelyn JerichoDarlene Mahaffy is an 53 y.o. female.   Chief Complaint: Neck and left arm pain HPI: 53 year old female with severe neck and left upper extremity pain paresthesias and weakness consistent with a mixed cervical radiculopathy. Patient's failed conservative management. Workup demonstrates evidence of significant spondylosis and stenosis with resultant exiting nerve root compression at both C5-6 and C6-7. Patient presents now for 2 level anterior cervical decompression and fusion.  Past Medical History  Diagnosis Date  . Complication of anesthesia     woke up during surgery  . PONV (postoperative nausea and vomiting)   . Hypertension   . Hypothyroidism   . Anxiety     increased heart rate with stress  . GERD (gastroesophageal reflux disease)   . H/O dizziness   . Arthritis   . Anemia     as child  . Hyperlipemia     Past Surgical History  Procedure Laterality Date  . Abdominal exploration surgery      History reviewed. No pertinent family history. Social History:  reports that she has quit smoking. She does not have any smokeless tobacco history on file. She reports that she drinks alcohol. She reports that she does not use illicit drugs.  Allergies:  Allergies  Allergen Reactions  . Azithromycin     hives  . Barley Grass   . Codeine     Nausea,Vomitng, Rash, Itching  . Gabapentin     Shortness of breath   . Gluten Meal     Get sick   . Lisinopril     Major GI symptoms    Medications Prior to Admission  Medication Sig Dispense Refill  . amLODipine (NORVASC) 10 MG tablet Take 10 mg by mouth daily.      Marland Kitchen. levothyroxine (SYNTHROID, LEVOTHROID) 50 MCG tablet Take 50 mcg by mouth daily before breakfast.      . pravastatin (PRAVACHOL) 40 MG tablet Take 40 mg by mouth daily.      . pregabalin (LYRICA) 75 MG capsule Take 75 mg by mouth 2 (two) times daily.      Marland Kitchen. tiZANidine (ZANAFLEX) 4 MG tablet Take 4 mg by mouth every 6 (six) hours as needed for muscle spasms.      .  traMADol (ULTRAM) 50 MG tablet Take 50-100 mg by mouth daily. Take 1 tablet when pain starts and if pain continues take two tablets        No results found for this or any previous visit (from the past 48 hour(s)). No results found.  Review of Systems  Constitutional: Negative.   HENT: Negative.   Eyes: Negative.   Respiratory: Negative.   Cardiovascular: Negative.   Gastrointestinal: Negative.   Genitourinary: Negative.   Musculoskeletal: Negative.   Skin: Negative.   Neurological: Negative.   Endo/Heme/Allergies: Negative.   Psychiatric/Behavioral: Negative.     Blood pressure 98/52, pulse 62, temperature 97.8 F (36.6 C), temperature source Oral, resp. rate 20, SpO2 97.00%. Physical Exam  Constitutional: She is oriented to person, place, and time. She appears well-developed and well-nourished. No distress.  HENT:  Head: Normocephalic and atraumatic.  Right Ear: External ear normal.  Left Ear: External ear normal.  Nose: Nose normal.  Mouth/Throat: Oropharynx is clear and moist. No oropharyngeal exudate.  Eyes: Conjunctivae and EOM are normal. Pupils are equal, round, and reactive to light. Right eye exhibits no discharge. Left eye exhibits no discharge.  Neck: Normal range of motion. Neck supple. No JVD present. No tracheal deviation present. No thyromegaly present.  Cardiovascular: Normal rate, regular rhythm, normal heart sounds and intact distal pulses.  Exam reveals no friction rub.   No murmur heard. Respiratory: Effort normal and breath sounds normal. No stridor. No respiratory distress. She has no wheezes.  GI: Soft. Bowel sounds are normal. She exhibits no distension. There is no tenderness.  Musculoskeletal: Normal range of motion. She exhibits no edema and no tenderness.  Neurological: She is alert and oriented to person, place, and time. She has normal reflexes. She displays normal reflexes. No cranial nerve deficit. She exhibits normal muscle tone. Coordination  normal.  Skin: Skin is warm and dry. No rash noted. She is not diaphoretic. No erythema.  Psychiatric: She has a normal mood and affect. Her behavior is normal. Judgment and thought content normal.     Assessment/Plan C5-6, C6-7 spondylosis with radiculopathy. Plan C5-6 and C6-7 anterior cervical decompression and fusion utilizing interbody cage, local autograft, and anterior plate instrumentation. Risks and benefits have been explained. Patient wishes to proceed.  Fidencio Duddy A 10/12/2013, 9:45 AM

## 2013-10-12 NOTE — Anesthesia Procedure Notes (Signed)
Procedure Name: Intubation Date/Time: 10/12/2013 10:03 AM Performed by: Sharlene DoryWALKER, Keshona Kartes E Pre-anesthesia Checklist: Patient identified, Emergency Drugs available, Suction available, Patient being monitored and Timeout performed Patient Re-evaluated:Patient Re-evaluated prior to inductionOxygen Delivery Method: Circle system utilized Preoxygenation: Pre-oxygenation with 100% oxygen Intubation Type: IV induction Ventilation: Mask ventilation without difficulty Laryngoscope Size: Mac and 3 Grade View: Grade II Tube type: Oral Tube size: 7.0 mm Number of attempts: 1 Airway Equipment and Method: Stylet Placement Confirmation: ETT inserted through vocal cords under direct vision,  positive ETCO2 and breath sounds checked- equal and bilateral Secured at: 20 cm Dental Injury: Teeth and Oropharynx as per pre-operative assessment

## 2013-10-13 ENCOUNTER — Encounter (HOSPITAL_COMMUNITY): Payer: Self-pay | Admitting: Neurosurgery

## 2013-10-29 ENCOUNTER — Other Ambulatory Visit: Payer: Self-pay | Admitting: Neurosurgery

## 2013-10-29 ENCOUNTER — Encounter (HOSPITAL_COMMUNITY): Payer: Self-pay

## 2013-10-29 ENCOUNTER — Encounter (HOSPITAL_COMMUNITY): Payer: Worker's Compensation | Admitting: Anesthesiology

## 2013-10-29 ENCOUNTER — Ambulatory Visit (HOSPITAL_COMMUNITY)
Admission: AD | Admit: 2013-10-29 | Discharge: 2013-10-30 | Disposition: A | Discharge status: Home / Self Care | Payer: Worker's Compensation | Source: Ambulatory Visit | Attending: Neurosurgery | Admitting: Neurosurgery

## 2013-10-29 ENCOUNTER — Observation Stay (HOSPITAL_COMMUNITY): Payer: Worker's Compensation | Admitting: Anesthesiology

## 2013-10-29 ENCOUNTER — Encounter (HOSPITAL_COMMUNITY)
Admission: AD | Disposition: A | Discharge status: Home / Self Care | Payer: Self-pay | Source: Ambulatory Visit | Attending: Neurosurgery

## 2013-10-29 DIAGNOSIS — Z981 Arthrodesis status: Secondary | ICD-10-CM | POA: Insufficient documentation

## 2013-10-29 DIAGNOSIS — T8149XA Infection following a procedure, other surgical site, initial encounter: Secondary | ICD-10-CM | POA: Diagnosis present

## 2013-10-29 DIAGNOSIS — F411 Generalized anxiety disorder: Secondary | ICD-10-CM | POA: Insufficient documentation

## 2013-10-29 DIAGNOSIS — K219 Gastro-esophageal reflux disease without esophagitis: Secondary | ICD-10-CM | POA: Insufficient documentation

## 2013-10-29 DIAGNOSIS — Y834 Other reconstructive surgery as the cause of abnormal reaction of the patient, or of later complication, without mention of misadventure at the time of the procedure: Secondary | ICD-10-CM | POA: Insufficient documentation

## 2013-10-29 DIAGNOSIS — Z87891 Personal history of nicotine dependence: Secondary | ICD-10-CM | POA: Insufficient documentation

## 2013-10-29 DIAGNOSIS — I10 Essential (primary) hypertension: Secondary | ICD-10-CM | POA: Insufficient documentation

## 2013-10-29 DIAGNOSIS — T8140XA Infection following a procedure, unspecified, initial encounter: Secondary | ICD-10-CM | POA: Insufficient documentation

## 2013-10-29 HISTORY — PX: LUMBAR WOUND DEBRIDEMENT: SHX1988

## 2013-10-29 LAB — GRAM STAIN

## 2013-10-29 SURGERY — LUMBAR WOUND DEBRIDEMENT
Anesthesia: General | Site: Neck

## 2013-10-29 MED ORDER — ACETAMINOPHEN 650 MG RE SUPP: 650.0000 mg | RECTAL | Status: DC | PRN

## 2013-10-29 MED ORDER — SODIUM CHLORIDE 0.9 % IJ SOLN
3.0000 mL | Freq: Two times a day (BID) | INTRAMUSCULAR | Status: DC
  Administered 2013-10-30: 3 mL via INTRAVENOUS

## 2013-10-29 MED ORDER — POTASSIUM CHLORIDE IN NACL 20-0.9 MEQ/L-% IV SOLN
INTRAVENOUS | Status: DC
  Filled 2013-10-29 (×3): qty 1000

## 2013-10-29 MED ORDER — FENTANYL CITRATE 0.05 MG/ML IJ SOLN
INTRAMUSCULAR | Status: DC | PRN
  Administered 2013-10-29: 50 ug via INTRAVENOUS

## 2013-10-29 MED ORDER — POLYETHYLENE GLYCOL 3350 17 G PO PACK
17.0000 g | PACK | Freq: Every day | ORAL | Status: DC | PRN
  Filled 2013-10-29: qty 1

## 2013-10-29 MED ORDER — SENNA 8.6 MG PO TABS
1.0000 | ORAL_TABLET | Freq: Two times a day (BID) | ORAL | Status: DC
  Administered 2013-10-29 – 2013-10-30 (×2): 8.6 mg via ORAL
  Filled 2013-10-29 (×2): qty 1

## 2013-10-29 MED ORDER — OXYCODONE HCL 5 MG/5ML PO SOLN: 5.0000 mg | Freq: Once | ORAL | Status: AC | PRN

## 2013-10-29 MED ORDER — LIDOCAINE HCL (CARDIAC) 20 MG/ML IV SOLN
INTRAVENOUS | Status: AC
  Filled 2013-10-29: qty 5

## 2013-10-29 MED ORDER — PHENOL 1.4 % MT LIQD: 1.0000 | OROMUCOSAL | Status: DC | PRN

## 2013-10-29 MED ORDER — BACITRACIN ZINC 500 UNIT/GM EX OINT
TOPICAL_OINTMENT | CUTANEOUS | Status: DC | PRN
  Administered 2013-10-29: 1 via TOPICAL

## 2013-10-29 MED ORDER — HYDROMORPHONE HCL PF 1 MG/ML IJ SOLN
0.2500 mg | INTRAMUSCULAR | Status: DC | PRN
  Administered 2013-10-29 (×2): 0.5 mg via INTRAVENOUS

## 2013-10-29 MED ORDER — SIMVASTATIN 20 MG PO TABS
20.0000 mg | ORAL_TABLET | Freq: Every day | ORAL | Status: DC
  Filled 2013-10-29: qty 1

## 2013-10-29 MED ORDER — LIDOCAINE HCL (CARDIAC) 20 MG/ML IV SOLN
INTRAVENOUS | Status: DC | PRN
  Administered 2013-10-29: 50 mg via INTRAVENOUS

## 2013-10-29 MED ORDER — MIDAZOLAM HCL 2 MG/2ML IJ SOLN
INTRAMUSCULAR | Status: AC
  Filled 2013-10-29: qty 2

## 2013-10-29 MED ORDER — CEFAZOLIN SODIUM-DEXTROSE 2-3 GM-% IV SOLR
INTRAVENOUS | Status: DC | PRN
  Administered 2013-10-29: 2 g via INTRAVENOUS

## 2013-10-29 MED ORDER — AMLODIPINE BESYLATE 10 MG PO TABS
10.0000 mg | ORAL_TABLET | Freq: Every day | ORAL | Status: DC
  Filled 2013-10-29: qty 1

## 2013-10-29 MED ORDER — FENTANYL CITRATE 0.05 MG/ML IJ SOLN
INTRAMUSCULAR | Status: AC
  Filled 2013-10-29: qty 5

## 2013-10-29 MED ORDER — PROPOFOL 10 MG/ML IV BOLUS
INTRAVENOUS | Status: DC | PRN
  Administered 2013-10-29: 150 mg via INTRAVENOUS

## 2013-10-29 MED ORDER — HYDROMORPHONE HCL PF 1 MG/ML IJ SOLN
INTRAMUSCULAR | Status: AC
Start: 2013-10-29 — End: 2013-10-30
  Filled 2013-10-29: qty 1

## 2013-10-29 MED ORDER — EPHEDRINE SULFATE 50 MG/ML IJ SOLN
INTRAMUSCULAR | Status: DC | PRN
  Administered 2013-10-29: 10 mg via INTRAVENOUS

## 2013-10-29 MED ORDER — SODIUM CHLORIDE 0.9 % IR SOLN
Status: DC | PRN
  Administered 2013-10-29: 1000 mL

## 2013-10-29 MED ORDER — PREGABALIN 50 MG PO CAPS
75.0000 mg | ORAL_CAPSULE | Freq: Two times a day (BID) | ORAL | Status: DC
  Administered 2013-10-29 – 2013-10-30 (×2): 75 mg via ORAL
  Filled 2013-10-29 (×4): qty 1

## 2013-10-29 MED ORDER — ONDANSETRON HCL 4 MG/2ML IJ SOLN
4.0000 mg | INTRAMUSCULAR | Status: DC | PRN
  Administered 2013-10-30: 4 mg via INTRAVENOUS
  Filled 2013-10-29: qty 2

## 2013-10-29 MED ORDER — LEVOTHYROXINE SODIUM 50 MCG PO TABS
50.0000 ug | ORAL_TABLET | Freq: Every day | ORAL | Status: DC
Start: 2013-10-30 — End: 2013-10-30
  Administered 2013-10-30: 50 ug via ORAL
  Filled 2013-10-29 (×2): qty 1

## 2013-10-29 MED ORDER — VANCOMYCIN HCL IN DEXTROSE 1-5 GM/200ML-% IV SOLN
1000.0000 mg | Freq: Three times a day (TID) | INTRAVENOUS | Status: AC
  Administered 2013-10-29 – 2013-10-30 (×2): 1000 mg via INTRAVENOUS
  Filled 2013-10-29 (×2): qty 200

## 2013-10-29 MED ORDER — OXYCODONE HCL 5 MG PO TABS
ORAL_TABLET | ORAL | Status: AC
Start: 2013-10-29 — End: 2013-10-30
  Filled 2013-10-29: qty 1

## 2013-10-29 MED ORDER — MORPHINE SULFATE 2 MG/ML IJ SOLN
1.0000 mg | INTRAMUSCULAR | Status: DC | PRN
  Administered 2013-10-29: 2 mg via INTRAVENOUS
  Filled 2013-10-29: qty 1

## 2013-10-29 MED ORDER — DIAZEPAM 5 MG PO TABS
5.0000 mg | ORAL_TABLET | Freq: Four times a day (QID) | ORAL | Status: DC | PRN
  Administered 2013-10-29 – 2013-10-30 (×2): 5 mg via ORAL
  Filled 2013-10-29 (×2): qty 1

## 2013-10-29 MED ORDER — SODIUM CHLORIDE 0.9 % IV SOLN: 250.0000 mL | INTRAVENOUS | Status: DC

## 2013-10-29 MED ORDER — MENTHOL 3 MG MT LOZG: 1.0000 | LOZENGE | OROMUCOSAL | Status: DC | PRN

## 2013-10-29 MED ORDER — SODIUM CHLORIDE 0.9 % IV SOLN
INTRAVENOUS | Status: DC | PRN
  Administered 2013-10-29: 19:00:00 via INTRAVENOUS

## 2013-10-29 MED ORDER — SCOPOLAMINE 1 MG/3DAYS TD PT72
MEDICATED_PATCH | TRANSDERMAL | Status: AC
  Filled 2013-10-29: qty 1

## 2013-10-29 MED ORDER — EPHEDRINE SULFATE 50 MG/ML IJ SOLN
INTRAMUSCULAR | Status: AC
Start: 2013-10-29 — End: 2013-10-29
  Filled 2013-10-29: qty 1

## 2013-10-29 MED ORDER — ACETAMINOPHEN 325 MG PO TABS: 650.0000 mg | ORAL_TABLET | ORAL | Status: DC | PRN

## 2013-10-29 MED ORDER — OXYCODONE HCL 5 MG PO TABS
5.0000 mg | ORAL_TABLET | Freq: Once | ORAL | Status: AC | PRN
  Administered 2013-10-29: 5 mg via ORAL

## 2013-10-29 MED ORDER — OXYCODONE HCL 5 MG PO TABS
5.0000 mg | ORAL_TABLET | ORAL | Status: DC | PRN
  Administered 2013-10-30 (×2): 10 mg via ORAL
  Filled 2013-10-29 (×2): qty 2

## 2013-10-29 MED ORDER — SODIUM CHLORIDE 0.9 % IV SOLN
INTRAVENOUS | Status: DC
  Administered 2013-10-29: 18:00:00 via INTRAVENOUS

## 2013-10-29 MED ORDER — MIDAZOLAM HCL 5 MG/5ML IJ SOLN
INTRAMUSCULAR | Status: DC | PRN
  Administered 2013-10-29: 2 mg via INTRAVENOUS

## 2013-10-29 MED ORDER — TIZANIDINE HCL 4 MG PO TABS
4.0000 mg | ORAL_TABLET | Freq: Four times a day (QID) | ORAL | Status: DC | PRN
  Filled 2013-10-29: qty 1

## 2013-10-29 MED ORDER — SODIUM CHLORIDE 0.9 % IJ SOLN: 3.0000 mL | INTRAMUSCULAR | Status: DC | PRN

## 2013-10-29 SURGICAL SUPPLY — 65 items
BAG DECANTER FOR FLEXI CONT (MISCELLANEOUS) IMPLANT
BENZOIN TINCTURE PRP APPL 2/3 (GAUZE/BANDAGES/DRESSINGS) IMPLANT
BLADE SURG ROTATE 9660 (MISCELLANEOUS) IMPLANT
CANISTER SUCT 3000ML (MISCELLANEOUS) ×3 IMPLANT
CLOSURE WOUND 1/2 X4 (GAUZE/BANDAGES/DRESSINGS)
CONT SPEC 4OZ CLIKSEAL STRL BL (MISCELLANEOUS) IMPLANT
DECANTER SPIKE VIAL GLASS SM (MISCELLANEOUS) IMPLANT
DRAPE LAPAROTOMY 100X72X124 (DRAPES) IMPLANT
DRAPE POUCH INSTRU U-SHP 10X18 (DRAPES) ×3 IMPLANT
DRAPE SURG 17X23 STRL (DRAPES) IMPLANT
DRSG OPSITE POSTOP 3X4 (GAUZE/BANDAGES/DRESSINGS) ×3 IMPLANT
DURAPREP 26ML APPLICATOR (WOUND CARE) ×3 IMPLANT
ELECT REM PT RETURN 9FT ADLT (ELECTROSURGICAL) ×3
ELECTRODE REM PT RTRN 9FT ADLT (ELECTROSURGICAL) ×1 IMPLANT
GAUZE SPONGE 4X4 16PLY XRAY LF (GAUZE/BANDAGES/DRESSINGS) IMPLANT
GLOVE BIO SURGEON STRL SZ 6.5 (GLOVE) IMPLANT
GLOVE BIO SURGEON STRL SZ7 (GLOVE) ×3 IMPLANT
GLOVE BIO SURGEON STRL SZ7.5 (GLOVE) IMPLANT
GLOVE BIO SURGEON STRL SZ8 (GLOVE) IMPLANT
GLOVE BIO SURGEON STRL SZ8.5 (GLOVE) IMPLANT
GLOVE BIO SURGEONS STRL SZ 6.5 (GLOVE)
GLOVE BIOGEL M 8.0 STRL (GLOVE) IMPLANT
GLOVE BIOGEL PI IND STRL 7.5 (GLOVE) ×1 IMPLANT
GLOVE BIOGEL PI INDICATOR 7.5 (GLOVE) ×2
GLOVE ECLIPSE 6.5 STRL STRAW (GLOVE) ×3 IMPLANT
GLOVE ECLIPSE 7.0 STRL STRAW (GLOVE) IMPLANT
GLOVE ECLIPSE 7.5 STRL STRAW (GLOVE) IMPLANT
GLOVE ECLIPSE 8.0 STRL XLNG CF (GLOVE) IMPLANT
GLOVE ECLIPSE 8.5 STRL (GLOVE) IMPLANT
GLOVE EXAM NITRILE LRG STRL (GLOVE) IMPLANT
GLOVE EXAM NITRILE MD LF STRL (GLOVE) IMPLANT
GLOVE EXAM NITRILE XL STR (GLOVE) IMPLANT
GLOVE EXAM NITRILE XS STR PU (GLOVE) IMPLANT
GLOVE INDICATOR 6.5 STRL GRN (GLOVE) IMPLANT
GLOVE INDICATOR 7.0 STRL GRN (GLOVE) IMPLANT
GLOVE INDICATOR 7.5 STRL GRN (GLOVE) IMPLANT
GLOVE INDICATOR 8.0 STRL GRN (GLOVE) IMPLANT
GLOVE INDICATOR 8.5 STRL (GLOVE) IMPLANT
GLOVE OPTIFIT SS 8.0 STRL (GLOVE) IMPLANT
GLOVE SURG SS PI 6.5 STRL IVOR (GLOVE) IMPLANT
GOWN BRE IMP SLV AUR LG STRL (GOWN DISPOSABLE) IMPLANT
GOWN BRE IMP SLV AUR XL STRL (GOWN DISPOSABLE) IMPLANT
GOWN STRL REIN 2XL LVL4 (GOWN DISPOSABLE) IMPLANT
GOWN STRL REUS W/ TWL LRG LVL3 (GOWN DISPOSABLE) ×2 IMPLANT
GOWN STRL REUS W/TWL LRG LVL3 (GOWN DISPOSABLE) ×4
KIT BASIN OR (CUSTOM PROCEDURE TRAY) ×3 IMPLANT
KIT ROOM TURNOVER OR (KITS) ×3 IMPLANT
NS IRRIG 1000ML POUR BTL (IV SOLUTION) ×3 IMPLANT
PACK LAMINECTOMY NEURO (CUSTOM PROCEDURE TRAY) ×3 IMPLANT
PAD ARMBOARD 7.5X6 YLW CONV (MISCELLANEOUS) ×9 IMPLANT
SPONGE GAUZE 4X4 12PLY (GAUZE/BANDAGES/DRESSINGS) IMPLANT
SPONGE LAP 4X18 X RAY DECT (DISPOSABLE) IMPLANT
SPONGE SURGIFOAM ABS GEL SZ50 (HEMOSTASIS) IMPLANT
STRIP CLOSURE SKIN 1/2X4 (GAUZE/BANDAGES/DRESSINGS) IMPLANT
SUT ETHILON 4 0 PS 2 18 (SUTURE) ×3 IMPLANT
SUT VIC AB 0 CT1 18XCR BRD8 (SUTURE) IMPLANT
SUT VIC AB 0 CT1 8-18 (SUTURE)
SUT VIC AB 2-0 CT1 18 (SUTURE) IMPLANT
SUT VIC AB 3-0 SH 8-18 (SUTURE) ×3 IMPLANT
SWAB CULTURE LIQ STUART DBL (MISCELLANEOUS) ×3 IMPLANT
SYR 20ML ECCENTRIC (SYRINGE) IMPLANT
TOWEL OR 17X24 6PK STRL BLUE (TOWEL DISPOSABLE) ×3 IMPLANT
TOWEL OR 17X26 10 PK STRL BLUE (TOWEL DISPOSABLE) ×3 IMPLANT
TUBE ANAEROBIC SPECIMEN COL (MISCELLANEOUS) ×3 IMPLANT
WATER STERILE IRR 1000ML POUR (IV SOLUTION) IMPLANT

## 2013-10-29 NOTE — Anesthesia Preprocedure Evaluation (Addendum)
Anesthesia Evaluation  Patient identified by MRN, date of birth, ID band Patient awake    Reviewed: Allergy & Precautions, H&P , NPO status , Patient's Chart, lab work & pertinent test results  History of Anesthesia Complications (+) PONV  Airway Mallampati: II TM Distance: >3 FB Neck ROM: Full    Dental no notable dental hx. (+) Teeth Intact, Dental Advisory Given   Pulmonary neg pulmonary ROS, former smoker,  breath sounds clear to auscultation  Pulmonary exam normal       Cardiovascular hypertension, Pt. on medications Rhythm:Regular Rate:Normal     Neuro/Psych Anxiety negative neurological ROS  negative psych ROS   GI/Hepatic Neg liver ROS, GERD-  Controlled,  Endo/Other  negative endocrine ROSHypothyroidism   Renal/GU negative Renal ROS  negative genitourinary   Musculoskeletal   Abdominal   Peds  Hematology negative hematology ROS (+)   Anesthesia Other Findings   Reproductive/Obstetrics negative OB ROS                         Anesthesia Physical Anesthesia Plan  ASA: II  Anesthesia Plan: General   Post-op Pain Management:    Induction: Intravenous  Airway Management Planned: Oral ETT  Additional Equipment:   Intra-op Plan:   Post-operative Plan: Extubation in OR  Informed Consent: I have reviewed the patients History and Physical, chart, labs and discussed the procedure including the risks, benefits and alternatives for the proposed anesthesia with the patient or authorized representative who has indicated his/her understanding and acceptance.   Dental advisory given  Plan Discussed with: CRNA and Surgeon  Anesthesia Plan Comments:         Anesthesia Quick Evaluation

## 2013-10-29 NOTE — H&P (Signed)
BP 113/54  Pulse 80  Temp(Src) 97.8 F (36.6 C) (Oral)  Resp 20  SpO2 97% Katelyn Mcdaniel is a 53 y.o. female Whom presented to the office this afternoon complaining of purulent drainage from the ACDF incision. She is also complaining of increased pain in the right upper extremity.  Past Medical History  Diagnosis Date  . Complication of anesthesia     woke up during surgery  . PONV (postoperative nausea and vomiting)   . Hypertension   . Hypothyroidism   . Anxiety     increased heart rate with stress  . GERD (gastroesophageal reflux disease)   . H/O dizziness   . Arthritis   . Anemia     as child  . Hyperlipemia    Past Surgical History  Procedure Laterality Date  . Abdominal exploration surgery    . Anterior cervical decomp/discectomy fusion N/A 10/12/2013    Procedure: CERVICAL FIVE TO SIX, CERVICAL SIX TO SEVEN ANTERIOR CERVICAL DECOMPRESSION/DISCECTOMY FUSION 2 LEVELS;  Surgeon: Temple Pacini, MD;  Location: [facility];  Service: Neurosurgery;  Laterality: N/A;   Prior to Admission medications   Medication Sig Start Date End Date Taking? Authorizing Provider  amLODipine (NORVASC) 10 MG tablet Take 10 mg by mouth daily.   Yes Historical Provider, MD  cyclobenzaprine (FLEXERIL) 10 MG tablet Take 1 tablet (10 mg total) by mouth 3 (three) times daily as needed for muscle spasms. 10/12/13  Yes Temple Pacini, MD  levothyroxine (SYNTHROID, LEVOTHROID) 50 MCG tablet Take 50 mcg by mouth daily before breakfast.   Yes Historical Provider, MD  oxyCODONE (ROXICODONE) 5 MG immediate release tablet Take 1-2 tablets (5-10 mg total) by mouth every 4 (four) hours as needed for severe pain. 10/12/13  Yes Temple Pacini, MD  pravastatin (PRAVACHOL) 40 MG tablet Take 40 mg by mouth daily.   Yes Historical Provider, MD  pregabalin (LYRICA) 75 MG capsule Take 75 mg by mouth 2 (two) times daily.   Yes Historical Provider, MD  tiZANidine (ZANAFLEX) 4 MG tablet Take 4 mg by mouth every 6 (six)  hours as needed for muscle spasms.   Yes Historical Provider, MD  traMADol (ULTRAM) 50 MG tablet Take 50-100 mg by mouth daily. Take 1 tablet when pain starts and if pain continues take two tablets   Yes Historical Provider, MD   No family history on file. History   Social History  . Marital Status: Divorced    Spouse Name: N/A    Number of Children: N/A  . Years of Education: N/A   Occupational History  . Not on file.   Social History Main Topics  . Smoking status: Former Games developer  . Smokeless tobacco: Not on file  . Alcohol Use: Yes     Comment: social  . Drug Use: No  . Sexual Activity: Not on file   Other Topics Concern  . Not on file   Social History Narrative  . No narrative on file   Physical Exam  Constitutional: She is oriented to person, place, and time. She appears well-developed and well-nourished. She appears distressed.  HENT:  Head: Normocephalic and atraumatic.  Eyes: Conjunctivae and EOM are normal. Pupils are equal, round, and reactive to light.  Neck: No tracheal deviation present. No thyromegaly present.  Inflamed wound, able to express to purulence from the wound. Wound is tender. Dressing which I removed had drainage on it. Superior edge of wound is more inflamed.  Cardiovascular: Normal rate, regular rhythm and  normal heart sounds.   Pulmonary/Chest: Effort normal and breath sounds normal.  Neurological: She is alert and oriented to person, place, and time. She displays normal reflexes. No cranial nerve deficit. She exhibits normal muscle tone. Coordination normal.  Skin: Skin is warm and dry.  Psychiatric: She has a normal mood and affect. Her behavior is normal. Judgment and thought content normal.   A/P Admit for surgical debridement, IV antibiotics. Risks and benefits explained

## 2013-10-29 NOTE — Op Note (Signed)
  10/29/2013  8:02 PM  PATIENT:  [name]  53 y.o. female whom presented with a wound with purulent drainage. I elected to take her to the operating room for debridement and irrigation.  PRE-OPERATIVE DIAGNOSIS:  Cervical wound infection superficial  POST-OPERATIVE DIAGNOSIS:  Cervical wound infection superficial supra platysmal  PROCEDURE:  Procedure(s): CERVICAL WOUND DEBRIDEMENT  SURGEON:  Surgeon(s): Carmela HurtKyle L Sheryle Vice, MD  ASSISTANTS:none  ANESTHESIA:   general  EBL:  Total I/O In: 900 [I.V.:900] Out: -   BLOOD ADMINISTERED:none  CELL SAVER GIVEN:none  COUNT:per nursing  DRAINS: none   SPECIMEN:  Source of Specimen:  cervical wound  DICTATION: [name] was taken to the operating room, and given general anesthetic via an LMA. Her head was positioned on a doughnut. Her incision was prepped with duraprep.I opened the incision with a 15 blade and further opened with metzenbaum scissors. I did not find any pus. I did open the platysma with a snap and there was no drainage in the infraplatysmal space. I irrigated ~a liter of saline into the wound. I then closed with a 4-0 nylon with simple interrupted sutures. I applied a sterile dressing.   PLAN OF CARE: Admit for overnight observation  PATIENT DISPOSITION:  PACU - hemodynamically stable.   Delay start of Pharmacological VTE agent (>24hrs) due to surgical blood loss or risk of bleeding:  yes

## 2013-10-29 NOTE — Transfer of Care (Signed)
Immediate Anesthesia Transfer of Care Note  Patient: Katelyn Mcdaniel  Procedure(s) Performed: Procedure(s) with comments: CERVICAL WOUND DEBRIDEMENT (N/A) - CERVICAL WOUND DEBRIDEMENT  Patient Location: PACU  Anesthesia Type:General  Level of Consciousness: awake and alert   Airway & Oxygen Therapy: Patient Spontanous Breathing and Patient connected to nasal cannula oxygen  Post-op Assessment: Report given to PACU RN and Post -op Vital signs reviewed and stable  Post vital signs: Reviewed and stable  Complications: No apparent anesthesia complications

## 2013-10-29 NOTE — Anesthesia Postprocedure Evaluation (Signed)
  Anesthesia Post-op Note  Patient: Katelyn Mcdaniel  Procedure(s) Performed: Procedure(s) with comments: CERVICAL WOUND DEBRIDEMENT (N/A) - CERVICAL WOUND DEBRIDEMENT  Patient Location: PACU  Anesthesia Type:General  Level of Consciousness: awake and alert   Airway and Oxygen Therapy: Patient Spontanous Breathing  Post-op Pain: none  Post-op Assessment: Post-op Vital signs reviewed, Patient's Cardiovascular Status Stable and Respiratory Function Stable  Post-op Vital Signs: Reviewed  Filed Vitals:   10/29/13 2005  BP: 149/74  Pulse: 102  Temp: 36.7 C  Resp: 27    Complications: No apparent anesthesia complications

## 2013-10-29 NOTE — Progress Notes (Signed)
MD paged for pain medication for patient and also requested for diet order.

## 2013-10-29 NOTE — Progress Notes (Signed)
ANTIBIOTIC CONSULT NOTE - INITIAL  Pharmacy Consult for Vancomycin Indication: Post op surgical prophylaxsis, no drain  Allergies  Allergen Reactions  . Azithromycin     hives  . Barley Grass   . Codeine     Nausea,Vomitng, Rash, Itching  . Gabapentin     Shortness of breath   . Gluten Meal     Get sick   . Lisinopril     Major GI symptoms    Patient Measurements: Height: 5' 0.5" (153.7 cm) Weight: 178 lb (80.74 kg) IBW/kg (Calculated) : 46.65  Vital Signs: Temp: 98.3 F (36.8 C) (04/02 2140) Temp src: Oral (04/02 2140) BP: 99/64 mmHg (04/02 2140) Pulse Rate: 90 (04/02 2140) Intake/Output from previous day:   Intake/Output from this shift: Total I/O In: 920 [P.O.:20; I.V.:900] Out: 1 [Urine:1]  Labs: No results found for this basename: WBC, HGB, PLT, LABCREA, CREATININE,  in the last 72 hours Estimated Creatinine Clearance: 76.5 ml/min (by C-G formula based on Cr of 0.72). No results found for this basename: VANCOTROUGH, Leodis Binet, VANCORANDOM, GENTTROUGH, GENTPEAK, GENTRANDOM, TOBRATROUGH, TOBRAPEAK, TOBRARND, AMIKACINPEAK, AMIKACINTROU, AMIKACIN,  in the last 72 hours   Microbiology: Recent Results (from the past 720 hour(s))  SURGICAL PCR SCREEN     Status: None   Collection Time    10/05/13 12:59 PM      Result Value Ref Range Status   MRSA, PCR NEGATIVE  NEGATIVE Final   Staphylococcus aureus NEGATIVE  NEGATIVE Final   Comment:            The Xpert SA Assay (FDA     approved for NASAL specimens     in patients over 55 years of age),     is one component of     a comprehensive surveillance     program.  Test performance has     been validated by [employer] for patients greater     than or equal to 68 year old.     It is not intended     to diagnose infection nor to     guide or monitor treatment.  GRAM STAIN     Status: None   Collection Time    10/29/13  7:42 PM      Result Value Ref Range Status   Specimen Description WOUND NECK   Final    Special Requests CERVICAL WOUND   Final   Gram Stain     Final   Value: ABUNDANT WBC PRESENT,BOTH PMN AND MONONUCLEAR     RARE GRAM POSITIVE COCCI IN PAIRS     Gram Stain Report Called to,Read Back By and Verified With: B RIDGE RN 2040 10/29/13 A BROWNING   Report Status 10/29/2013 FINAL   Final  WOUND CULTURE     Status: None   Collection Time    10/29/13  7:42 PM      Result Value Ref Range Status   Specimen Description WOUND NECK RIGHT   Final   Special Requests CERVICAL WOUND   Final   Gram Stain PENDING   Incomplete   Culture PENDING   Incomplete   Report Status PENDING   Incomplete  ANAEROBIC CULTURE     Status: None   Collection Time    10/29/13  7:42 PM      Result Value Ref Range Status   Specimen Description WOUND NECK RIGHT   Final   Special Requests CERVICAL WOUND   Final   Gram Stain PENDING  Incomplete   Culture PENDING   Incomplete   Report Status PENDING   Incomplete    Medical History: Past Medical History  Diagnosis Date  . Complication of anesthesia     woke up during surgery  . PONV (postoperative nausea and vomiting)   . Hypertension   . Hypothyroidism   . Anxiety     increased heart rate with stress  . GERD (gastroesophageal reflux disease)   . H/O dizziness   . Arthritis   . Anemia     as child  . Hyperlipemia     Medications:  Prescriptions prior to admission  Medication Sig Dispense Refill  . amLODipine (NORVASC) 10 MG tablet Take 10 mg by mouth daily.      . cyclobenzaprine (FLEXERIL) 10 MG tablet Take 1 tablet (10 mg total) by mouth 3 (three) times daily as needed for muscle spasms.  30 tablet  0  . levothyroxine (SYNTHROID, LEVOTHROID) 50 MCG tablet Take 50 mcg by mouth daily before breakfast.      . oxyCODONE (ROXICODONE) 5 MG immediate release tablet Take 1-2 tablets (5-10 mg total) by mouth every 4 (four) hours as needed for severe pain.  50 tablet  0  . pravastatin (PRAVACHOL) 40 MG tablet Take 40 mg by mouth daily.      .  pregabalin (LYRICA) 75 MG capsule Take 75 mg by mouth 2 (two) times daily.      Marland Kitchen. tiZANidine (ZANAFLEX) 4 MG tablet Take 4 mg by mouth every 6 (six) hours as needed for muscle spasms.      . traMADol (ULTRAM) 50 MG tablet Take 50-100 mg by mouth daily. Take 1 tablet when pain starts and if pain continues take two tablets       Assessment: 53 yo F admitted 10/29/2013 s/p surgical debriedment of wound.  Pharmacy consulted to start vancomycin x 12h post op, as no drain is in place.  ID Surgical prophylaxsis 4/2 Vancomycin  Goal of Therapy:  Vancomycin trough level 10-15 mcg/ml  Plan:  1. Vancomycin 1g IV q8h x 1 doses for a full 12h post op coverage then DC 2. Pharmacy will sign off, please call with questions.   Thank you for allowing pharmacy to be a part of this patients care team.  Lovenia KimJulie Karryn Kosinski Pharm.D., BCPS Clinical Pharmacist 10/29/2013 10:07 PM Pager: [phone] Phone: [phone]

## 2013-10-29 NOTE — Progress Notes (Signed)
Patient admitted from MD's office. Patient alert and oriented x 4. MD's office called to let them know patient is on the floor.

## 2013-10-29 NOTE — Anesthesia Procedure Notes (Signed)
Date/Time: 10/29/2013 7:26 PM Performed by: Gwenyth AllegraADAMI, Mirha Brucato Pre-anesthesia Checklist: Patient identified, Emergency Drugs available, Suction available, Patient being monitored and Timeout performed Patient Re-evaluated:Patient Re-evaluated prior to inductionOxygen Delivery Method: Circle system utilized Preoxygenation: Pre-oxygenation with 100% oxygen Intubation Type: IV induction LMA: LMA inserted LMA Size: 4.0 Number of attempts: 1 Tube secured with: Tape Dental Injury: Teeth and Oropharynx as per pre-operative assessment

## 2013-10-30 MED ORDER — AMOXICILLIN-POT CLAVULANATE 500-125 MG PO TABS: 1.0000 | ORAL_TABLET | Freq: Three times a day (TID) | ORAL | Status: AC

## 2013-10-30 NOTE — Progress Notes (Signed)
Pt given D/C instructions with Rx, verbal understanding of teaching was given. Pt D/C'd home via wheelchair @ 1255 per MD order. Pt is stable @ D/C and has no other needs. Pt's IV was D/C'd prior to D/C. Rema FendtAshley Rylen Hou, RN

## 2013-11-01 LAB — WOUND CULTURE

## 2013-11-02 ENCOUNTER — Encounter (HOSPITAL_COMMUNITY): Payer: Self-pay | Admitting: Neurosurgery

## 2013-11-03 LAB — ANAEROBIC CULTURE

## 2015-11-14 IMAGING — RF DG C-ARM 61-120 MIN
1 series · 1 of 1 positions shown · non-contrast
Comparison: Cervical MRI 04/29/2013.

CLINICAL DATA: ACDF.

EXAM:
DG CERVICAL SPINE - 1 VIEW; DG C-ARM 1-60 MIN

[Series 1: run · 1 of 1 slices shown]
[im 1/1]
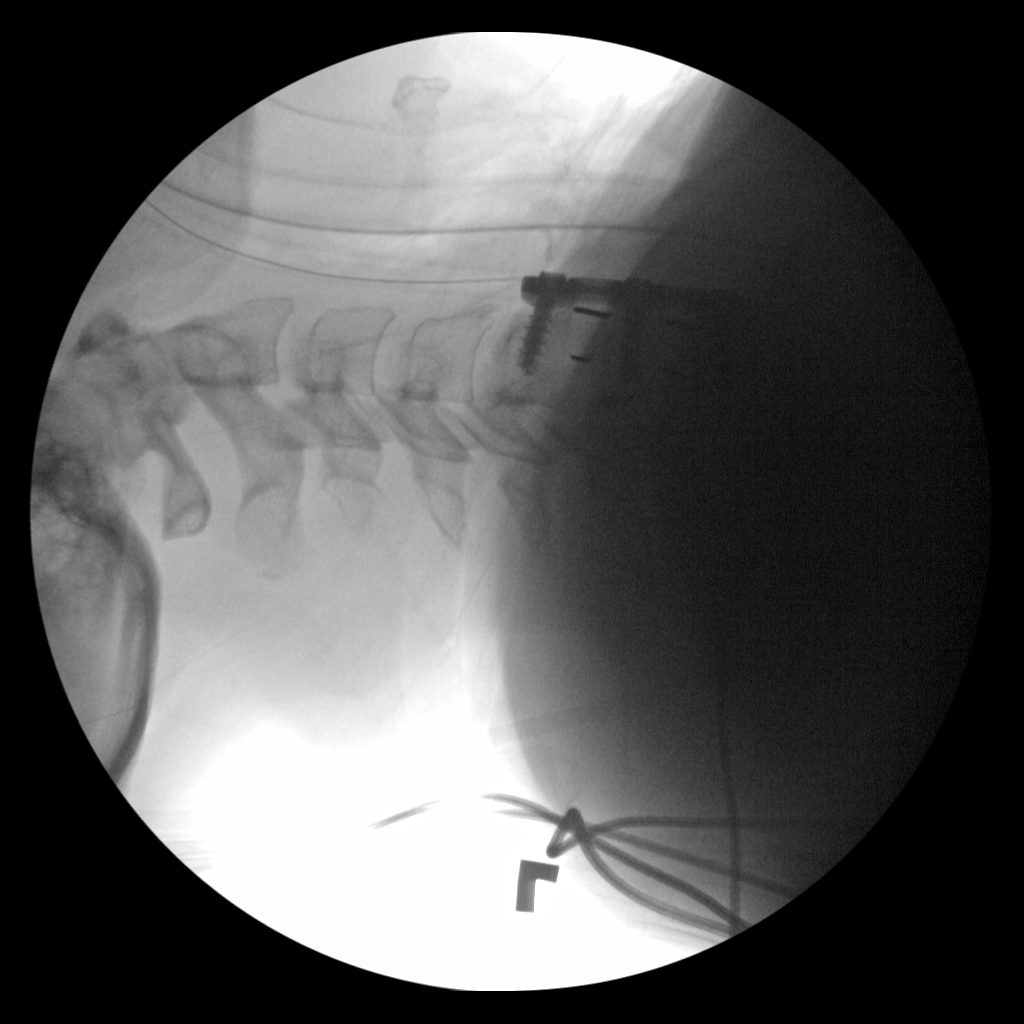

[1 of 1 positions shown; findings below may reference images not displayed]

FINDINGS: Single cross-table lateral spot fluoroscopic image demonstrates
interval anterior cervical discectomy and fusion extending
inferiorly from C5. The anterior plate and screws are not well
visualized inferior to the C6-7 disc space. Interbody spacers at
C5-6 and C6-7 appear well positioned.
IMPRESSION: Intraoperative views following lower cervical fusion as described
(likely from C5-7; the lower extent of the hardware is not well
visualized).
